# Patient Record
Sex: Female | Born: 2000 | Hispanic: No | Marital: Single | Smoking: Never smoker
Health system: Southern US, Community
[De-identification: ages and names within clinical notes are randomized; demographics above are authoritative.]

## PROBLEM LIST (undated history)

## (undated) DIAGNOSIS — E282 Polycystic ovarian syndrome: Secondary | ICD-10-CM

## (undated) DIAGNOSIS — E119 Type 2 diabetes mellitus without complications: Secondary | ICD-10-CM

## (undated) HISTORY — DX: Type 2 diabetes mellitus without complications: E11.9

## (undated) HISTORY — PX: TONSILLECTOMY AND ADENOIDECTOMY: SUR1326

## (undated) HISTORY — PX: MOUTH SURGERY: SHX715

## (undated) HISTORY — DX: Polycystic ovarian syndrome: E28.2

---
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
## 2022-01-10 ENCOUNTER — Telehealth: Payer: Medicaid Other | Admitting: Physician Assistant

## 2022-01-10 DIAGNOSIS — Z202 Contact with and (suspected) exposure to infections with a predominantly sexual mode of transmission: Secondary | ICD-10-CM

## 2022-01-10 NOTE — Progress Notes (Signed)
Because of mention of recent possible exposure to STI, I feel your condition warrants further evaluation and I recommend that you be seen in a face to face visit.   NOTE: There will be NO CHARGE for this eVisit   If you are having a true medical emergency please call 911.      For an urgent face to face visit,  has seven urgent care centers for your convenience:     [facility] at [facility] Directions [phone] [address] [place], [place] [address]    [facility]) Get Driving Directions [phone] [address] [place], [place] [address]  [facility]gent [facility] Center [facility]) Get Driving Directions [phone] [address] [place],  [place]  [address]  [facility] Center [facility] [employer] Directions  [phone] [address] [place],  [place] [address]   [facility] at [facility] Get Driving Directions [phone] [address], [address] [place], [place] [address]   [facility] Health Urgent Care at [facility] Get Driving Directions  [phone] [address] [place], [place] [address]   [facility] Health Urgent Care at [facility] Directions [phone] [address], [address] [place], [place] [address]  Your MyChart E-visit questionnaire answers were reviewed by a board certified advanced clinical practitioner to complete your personal care plan based on your specific symptoms.  Thank you for using e-Visits.

## 2022-01-15 ENCOUNTER — Other Ambulatory Visit: Payer: Self-pay | Admitting: Family Medicine

## 2022-01-15 ENCOUNTER — Encounter: Payer: Self-pay | Admitting: Family Medicine

## 2022-01-15 ENCOUNTER — Telehealth: Payer: Medicaid Other

## 2022-01-15 MED ORDER — NIRMATRELVIR/RITONAVIR (PAXLOVID)TABLET: 3.0000 | ORAL_TABLET | Freq: Two times a day (BID) | ORAL | 0 refills | Status: AC

## 2022-01-29 ENCOUNTER — Telehealth: Payer: Medicaid Other | Admitting: Family Medicine

## 2022-01-29 DIAGNOSIS — R300 Dysuria: Secondary | ICD-10-CM

## 2022-01-29 NOTE — Progress Notes (Signed)
[place]

## 2022-07-12 ENCOUNTER — Telehealth: Payer: Self-pay | Admitting: Family Medicine

## 2022-07-12 NOTE — Telephone Encounter (Signed)
Outbound call placed to offer patient an appointment for a diabetic retinal eye exam. Unable to reach patient; automated message says call cannot be completed at this time and to try the call again later.

## 2022-07-13 ENCOUNTER — Telehealth: Payer: Self-pay

## 2022-07-13 NOTE — Telephone Encounter (Signed)
LVM for patient to call back [phone], or to call PCP office to schedule follow up apt. AS, CMA  

## 2022-07-18 ENCOUNTER — Ambulatory Visit
Admission: RE | Admit: 2022-07-18 | Discharge: 2022-07-18 | Disposition: A | Discharge status: Home / Self Care | Payer: Medicaid Other | Source: Ambulatory Visit | Attending: Internal Medicine | Admitting: Internal Medicine

## 2022-07-18 VITALS — BP 130/80 | HR 104 | Temp 98.7°F | Resp 18

## 2022-07-18 DIAGNOSIS — A749 Chlamydial infection, unspecified: Secondary | ICD-10-CM | POA: Diagnosis not present

## 2022-07-18 DIAGNOSIS — Z113 Encounter for screening for infections with a predominantly sexual mode of transmission: Secondary | ICD-10-CM | POA: Insufficient documentation

## 2022-07-18 MED ORDER — AZITHROMYCIN 500 MG PO TABS: 1000.0000 mg | ORAL_TABLET | Freq: Once | ORAL | 0 refills | Status: AC

## 2022-07-18 NOTE — Discharge Instructions (Signed)
Your vaginal swab is pending.  I have prescribed you azithromycin to treat chlamydia infection.  Refrain from sexual activity until test results and treatment are complete.

## 2022-07-18 NOTE — ED Triage Notes (Signed)
Patient states she was recently tested for STDs and was positive for chlamydia. Patient states she threw up the prescription and wants another prescription.

## 2022-07-18 NOTE — ED Provider Notes (Signed)
[facility]    CSN: [id number] Arrival date & time: 07/18/22  1653      History   Chief Complaint Chief Complaint  Patient presents with   SEXUALLY TRANSMITTED DISEASE    HPI Sheena Dean is a 22 y.o. female.   Patient presents today for treatment for chlamydia.  Patient was seen at a different site in a different state about 3 weeks ago and tested positive for chlamydia at that time.  At that time, she was also having brown vaginal discharge which is now resolved.  She reports that she is concerned that it was not fully treated given that she only took 3 days of doxycycline.  She stopped taking it given that she was not able to tolerate it despite taking it with food.  Denies any new sexual partners.  Reports that she took a pregnancy test last week and it was negative.  She does not have normal menstrual cycles given that she has PCOS.  Denies any additional symptoms.     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 No pertinent surgical history.  OB History   No obstetric history on file.      Home Medications    Prior to Admission medications   Medication Sig Start Date End Date Taking? Authorizing Provider  azithromycin (ZITHROMAX) 500 MG tablet Take 2 tablets (1,000 mg total) by mouth once for 1 dose. 07/18/22 07/18/22 Yes Kavaughn Faucett, Acie Fredrickson, FNP  tirzepatide  [facility]) 5 MG/0.5ML Pen Inject 5 mg into the skin once a week. 05/15/21   Donita Brooks, MD    Family History Family History  Problem Relation Age of Onset   Obesity Brother    Breast cancer Maternal Grandmother 25    Social History Social History   Tobacco Use   Smoking status: Never   Smokeless tobacco: Never  Vaping Use   Vaping Use: Every day  Substance Use Topics   Alcohol use: No    Comment: occ   Drug use: No     Allergies   Latex   Review of Systems Review of Systems Per HPI  Physical Exam Triage Vital Signs ED Triage Vitals  Enc Vitals Group     BP 07/18/22 1703 130/80     Pulse Rate 07/18/22 1703 (!) 104     Resp 07/18/22 1703 18     Temp 07/18/22 1703 98.7 F (37.1 C)     Temp Source 07/18/22 1703 Oral     SpO2 07/18/22 1703 97 %     Weight --      Height --      Head Circumference --      Peak Flow --      Pain Score 07/18/22 1704 0     Pain Loc --      Pain Edu? --      Excl. in GC? --  No data found.  Updated Vital Signs BP 130/80 (BP Location: Left Arm)   Pulse (!) 104   Temp 98.7 F (37.1 C) (Oral)   Resp 18   LMP  (LMP Unknown)   SpO2 97%   Visual Acuity Right Eye Distance:   Left Eye Distance:   Bilateral Distance:    Right Eye Near:   Left Eye Near:    Bilateral Near:     Physical Exam Constitutional:      General: She is not in acute distress.    Appearance: Normal appearance. She is not toxic-appearing or diaphoretic.  HENT:     Head: Normocephalic and atraumatic.  Eyes:     Extraocular Movements: Extraocular movements intact.     Conjunctiva/sclera: Conjunctivae normal.  Pulmonary:     Effort: Pulmonary effort is normal.  Genitourinary:    Comments: Deferred with shared decision making.  Self swab performed. Neurological:     General: No focal deficit present.     Mental Status: She is alert and oriented to person, place, and time. Mental status is at baseline.  Psychiatric:        Mood and Affect:  Mood normal.        Behavior: Behavior normal.        Thought Content: Thought content normal.        Judgment: Judgment normal.      UC Treatments / Results  Labs (all labs ordered are listed, but only abnormal results are displayed) Labs Reviewed  CERVICOVAGINAL ANCILLARY ONLY    EKG   Radiology No results found.  Procedures Procedures (including critical care time)  Medications Ordered in UC Medications - No data to display  Initial Impression / Assessment and Plan / UC Course  I have reviewed the triage vital signs and the nursing notes.  Pertinent labs & imaging results that were available during my care of the patient were reviewed by me and considered in my medical decision making (see chart for details).     Patient presents today for chlamydia infection treatment.  She reports that she did not complete her previous doxycycline.  Will confirm with cervicovaginal swab.  Awaiting results.  Given patient did not complete treatment, will opt to treat again today.  Patient does not want to take doxycycline so will use alternative azithromycin 1 g dose.  She does not want to take medication today in urgent care so it was sent to the pharmacy for her to take.  Advised strict follow-up precautions and refraining from sexual activity until test results and treatment are complete.  Patient verbalized understanding and was agreeable with plan. Final Clinical Impressions(s) / UC Diagnoses   Final diagnoses:  Chlamydia infection  Screening examination for venereal disease     Discharge Instructions      Your vaginal swab is pending.  I have prescribed you azithromycin to treat chlamydia infection.  Refrain from sexual activity until test results and treatment are complete.    ED Prescriptions     Medication Sig Dispense Auth. Provider   azithromycin (ZITHROMAX) 500 MG tablet Take 2 tablets (1,000 mg total) by mouth once for 1 dose. 2 tablet [place], Acie Fredrickson, [place]       PDMP not reviewed this encounter.   Gustavus Bryant, [place] 07/18/22 1721

## 2022-07-19 LAB — CERVICOVAGINAL ANCILLARY ONLY
Bacterial Vaginitis (gardnerella): NEGATIVE
Candida Glabrata: NEGATIVE
Candida Vaginitis: NEGATIVE
Chlamydia: NEGATIVE
Comment: NEGATIVE
Comment: NEGATIVE
Comment: NEGATIVE
Comment: NEGATIVE
Comment: NEGATIVE
Comment: NORMAL
Neisseria Gonorrhea: NEGATIVE
Trichomonas: NEGATIVE

## 2022-09-07 ENCOUNTER — Ambulatory Visit
Admission: RE | Admit: 2022-09-07 | Discharge: 2022-09-07 | Disposition: A | Discharge status: Home / Self Care | Payer: Medicaid Other | Source: Ambulatory Visit | Attending: Internal Medicine | Admitting: Internal Medicine

## 2022-09-07 VITALS — BP 118/78 | HR 99 | Temp 98.3°F | Resp 16

## 2022-09-07 DIAGNOSIS — N3001 Acute cystitis with hematuria: Secondary | ICD-10-CM

## 2022-09-07 DIAGNOSIS — Z113 Encounter for screening for infections with a predominantly sexual mode of transmission: Secondary | ICD-10-CM | POA: Diagnosis not present

## 2022-09-07 DIAGNOSIS — N898 Other specified noninflammatory disorders of vagina: Secondary | ICD-10-CM

## 2022-09-07 DIAGNOSIS — Z3202 Encounter for pregnancy test, result negative: Secondary | ICD-10-CM

## 2022-09-07 LAB — POCT URINALYSIS DIP (MANUAL ENTRY)
Bilirubin, UA: NEGATIVE
Glucose, UA: NEGATIVE mg/dL
Ketones, POC UA: NEGATIVE mg/dL
Nitrite, UA: POSITIVE — AB
Spec Grav, UA: 1.015 (ref 1.010–1.025)
Urobilinogen, UA: 0.2 E.U./dL
pH, UA: 7 (ref 5.0–8.0)

## 2022-09-07 LAB — POCT URINE PREGNANCY: Preg Test, Ur: NEGATIVE

## 2022-09-07 MED ORDER — CEPHALEXIN 500 MG PO CAPS: 500.0000 mg | ORAL_CAPSULE | Freq: Two times a day (BID) | ORAL | 0 refills | Status: AC

## 2022-09-07 NOTE — ED Triage Notes (Signed)
Pt states she is having burning when she urinates and she is also here for a pregnancy test and to be rechecked for STD's.

## 2022-09-07 NOTE — Discharge Instructions (Addendum)
I am treating you for UTI with antibiotic today.  Urine culture and vaginal swab are pending.  Will call with that result.  Follow-up with gynecology at provided phone number for further evaluation and management.

## 2022-09-07 NOTE — ED Provider Notes (Signed)
[facility]    CSN: [id number] Arrival date & time: 09/07/22  4098      History   Chief Complaint Chief Complaint  Patient presents with   Medication Refill    Entered by patient    HPI Sheena Dean is a 22 y.o. female.   Patient presents with urinary burning and frequency that started a few days ago.  Denies any current vaginal discharge but reports that it was present prior to her menstrual cycle starting in mid July.  She also states that her menstrual cycle lasted for about 2 weeks which is unusual for her.  Otherwise, she has been having normal menstrual cycles.  She is sexually active and has had unprotected intercourse but denies exposure to STD.  Patient was seen on 07/18/2022 for STD testing at that time as well.  At that time, she stated that she had tested positive for chlamydia at a different office.  She did not tolerate doxycycline so she was prescribed a one-time dose of azithromycin.  She states that she never picked it up until yesterday where she attempted to take it but accidentally dropped in the toilet so she was not able to take that dose.  Denies any current vaginal discharge, abdominal pain, pelvic pain, back pain, fever, chills.  She does not use any form of birth control at this time.   Medication Refill   Past Medical History:  Diagnosis Date   Diabetes mellitus without complication (HCC)    PCOS (polycystic ovarian syndrome)     Patient Active Problem List   Diagnosis Date Noted   Moderate episode of recurrent major depressive disorder (HCC) 01/04/2021   Insomnia 01/04/2021   PCOS (polycystic ovarian syndrome) 02/24/2020   Diabetes mellitus (HCC) 02/24/2020   Tinea pedis of both feet 02/24/2020   UTI symptoms 02/10/2020   Class 2 obesity 01/19/2020   Acne vulgaris 12/15/2019   Strain of gastrocnemius tendon of right lower extremity 11/14/2017   Generalized anxiety disorder 07/16/2017   Depressed mood 07/16/2017    History  reviewed. No pertinent surgical history.  OB History   No obstetric history on file.      Home Medications    Prior to Admission medications   Medication Sig Start Date End Date Taking? Authorizing Provider  cephALEXin (KEFLEX) 500 MG capsule Take 1 capsule (500 mg total) by mouth 2 (two) times daily for 7 days. 09/07/22 09/14/22 Yes Jawad Wiacek, Acie Fredrickson, FNP  tirzepatide [facility]) 5 MG/0.5ML Pen Inject 5 mg into the skin once a week. 05/15/21   Donita Brooks, MD    Family History Family History  Problem Relation Age of Onset   Obesity Brother    Breast cancer Maternal Grandmother 78    Social History Social History   Tobacco Use   Smoking status: Never   Smokeless tobacco: Never  Vaping Use   Vaping status: Every Day  Substance Use Topics   Alcohol use: No    Comment: occ   Drug use: No     Allergies   Latex   Review of Systems Review of Systems Per HPI  Physical Exam Triage Vital Signs ED Triage Vitals  Encounter Vitals Group     BP 09/07/22 1848 118/78     Systolic BP Percentile --      Diastolic BP Percentile --      Pulse Rate 09/07/22 1848 99     Resp 09/07/22 1848 16     Temp 09/07/22 1848 98.3  F (36.8 C)     Temp Source 09/07/22 1848 Oral     SpO2 09/07/22 1848 96 %     Weight --      Height --      Head Circumference --      Peak Flow --      Pain Score 09/07/22 1855 0     Pain Loc --      Pain Education --      Exclude from Growth Chart --    No data found.  Updated Vital Signs BP 118/78 (BP Location: Left Arm)   Pulse 99   Temp 98.3 F (36.8 C) (Oral)   Resp 16   LMP 08/12/2022 (Exact Date)   SpO2 96%   Visual Acuity Right Eye Distance:   Left Eye Distance:   Bilateral Distance:    Right Eye Near:   Left Eye Near:    Bilateral Near:     Physical Exam Constitutional:      General: She is not in acute distress.    Appearance: Normal appearance. She is not toxic-appearing or diaphoretic.  HENT:     Head: Normocephalic  and atraumatic.  Eyes:     Extraocular Movements: Extraocular movements intact.     Conjunctiva/sclera: Conjunctivae normal.  Pulmonary:     Effort: Pulmonary effort is normal.  Genitourinary:    Comments: Deferred with shared decision making. Self swab performed.  Neurological:     General: No focal deficit present.     Mental Status: She is alert and oriented to person, place, and time. Mental status is at baseline.  Psychiatric:        Mood and Affect: Mood normal.        Behavior: Behavior normal.        Thought Content: Thought content normal.        Judgment: Judgment normal.      UC Treatments / Results  Labs (all labs ordered are listed, but only abnormal results are displayed) Labs Reviewed  POCT URINALYSIS DIP (MANUAL ENTRY) - Abnormal; Notable for the following components:      Result Value   Color, UA straw (*)    Clarity, UA cloudy (*)    Blood, UA small (*)    Protein Ur, POC trace (*)    Nitrite, UA Positive (*)    Leukocytes, UA Large (3+) (*)    All other components within normal limits  URINE CULTURE  POCT URINE PREGNANCY  CERVICOVAGINAL ANCILLARY ONLY    EKG   Radiology No results found.  Procedures Procedures (including critical care time)  Medications Ordered in UC Medications - No data to display  Initial Impression / Assessment and Plan / UC Course  I have reviewed the triage vital signs and the nursing notes.  Pertinent labs & imaging results that were available during my care of the patient were reviewed by me and considered in my medical decision making (see chart for details).     UA indicating possible UTI so will opt to treat with cephalexin today.  Urine culture pending.  Patient also requesting STD testing with vaginal swab so this is pending as well.  Patient's previous vaginal swab on 6/19 was negative for chlamydia so will await swab prior to doing any additional treatment despite patient not taking medication that was  prescribed at previous visit.  Pregnancy test negative.  Given patient had an abnormal menstrual cycle, recommended that she see gynecology for further evaluation and management for  this and patient was provided with contact information for gynecology.  Advised strict follow-up precautions.  Patient verbalized understanding and was agreeable with plan. Final Clinical Impressions(s) / UC Diagnoses   Final diagnoses:  Acute cystitis with hematuria  Urine pregnancy test negative  Vaginal discharge  Screening examination for venereal disease     Discharge Instructions      I am treating you for UTI with antibiotic today.  Urine culture and vaginal swab are pending.  Will call with that result.  Follow-up with gynecology at provided phone number for further evaluation and management.    ED Prescriptions     Medication Sig Dispense Auth. Provider   cephALEXin (KEFLEX) 500 MG capsule Take 1 capsule (500 mg total) by mouth 2 (two) times daily for 7 days. 14 capsule [place], Acie Fredrickson, [place]      PDMP not reviewed this encounter.   Gustavus Bryant, [place] 09/07/22 [phone]

## 2022-09-10 ENCOUNTER — Telehealth: Payer: Self-pay

## 2022-09-10 LAB — CERVICOVAGINAL ANCILLARY ONLY
Bacterial Vaginitis (gardnerella): NEGATIVE
Candida Glabrata: NEGATIVE
Candida Vaginitis: NEGATIVE
Chlamydia: NEGATIVE
Comment: NEGATIVE
Comment: NEGATIVE
Comment: NEGATIVE
Comment: NEGATIVE
Comment: NEGATIVE
Comment: NORMAL
Neisseria Gonorrhea: NEGATIVE
Trichomonas: NEGATIVE

## 2022-09-10 NOTE — Telephone Encounter (Signed)
Patient "returning someone's call regarding results".  After review of labs/notes I discussed the following per Marianna Payment, UC (see below):   Hello Sheena Dean,   Your urine culture showed you do have a UTI and the antibiotic we sent you home on will treat it.  Please make sure to take it until it is completely gone and follow up with your Primary Care doctor if you continue to have symptoms after completion.     If you have any questions or concerns, please do not hesitate to reach out at [phone].   Sincerely,   Kendell, Urgent Care RN

## 2023-02-01 ENCOUNTER — Ambulatory Visit (INDEPENDENT_AMBULATORY_CARE_PROVIDER_SITE_OTHER): Payer: Medicaid Other | Admitting: Family Medicine

## 2023-02-01 ENCOUNTER — Ambulatory Visit: Payer: Self-pay | Admitting: Family Medicine

## 2023-02-01 ENCOUNTER — Encounter: Payer: Self-pay | Admitting: Family Medicine

## 2023-02-01 ENCOUNTER — Encounter (INDEPENDENT_AMBULATORY_CARE_PROVIDER_SITE_OTHER): Payer: Medicaid Other | Admitting: Family Medicine

## 2023-02-01 VITALS — BP 120/72 | HR 84 | Temp 98.4°F | Ht 62.0 in | Wt 210.0 lb

## 2023-02-01 DIAGNOSIS — E1169 Type 2 diabetes mellitus with other specified complication: Secondary | ICD-10-CM | POA: Diagnosis not present

## 2023-02-01 DIAGNOSIS — N938 Other specified abnormal uterine and vaginal bleeding: Secondary | ICD-10-CM | POA: Diagnosis not present

## 2023-02-01 MED ORDER — NORETHINDRONE ACET-ETHINYL EST 1.5-30 MG-MCG PO TABS: 1.0000 | ORAL_TABLET | Freq: Every day | ORAL | 11 refills | Status: DC

## 2023-02-01 NOTE — Progress Notes (Signed)
 Subjective:    Patient ID: Sheena Dean, female    DOB: 01/08/2001, 23 y.o.   MRN: [id number]  HPI 2 issues.  First the patient states that she never has regular periods.  She states that she has a menstrual cycle every 2 to 3 months.  Her menses has variable duration.  Sometimes it can last 2 weeks.  Most recently it is lasted for weeks.  She reports spotting and bleeding for the last 4 weeks straight.  At other times she will have no menstrual cycles for 2 or 3 months.  She took 2 pregnancy test yesterday that were negative.  She has a history of PCOS.  She also has a history of type 2 diabetes.  Her hemoglobin A1c over a year ago was 6.8.  I started the patient on Mounjaro  but she has since stopped the medication due to nausea Past Medical History:  Diagnosis Date   Diabetes mellitus without complication (HCC)    PCOS (polycystic ovarian syndrome)    No past surgical history on file.   Allergies  Allergen Reactions   Latex Hives   Social History   Socioeconomic History   Marital status: Single    Spouse name: Not on file   Number of children: Not on file   Years of education: Not on file   Highest education level: Not on file  Occupational History   Not on file  Tobacco Use   Smoking status: Never   Smokeless tobacco: Never  Vaping Use   Vaping status: Every Day  Substance and Sexual Activity   Alcohol use: No    Comment: occ   Drug use: No   Sexual activity: Yes    Comment: Lives with mom, dad, and brother  Other Topics Concern   Not on file  Social History Narrative   Currently in fifth grade.  No menarche. [place] II.     Social Drivers of Corporate Investment Banker Strain: Not on file  Food Insecurity: Not on file  Transportation Needs: Not on file  Physical Activity: Not on file  Stress: Not on file  Social Connections: Unknown (08/18/2021)   Received from [facility]   Social Network    Social Network: Not on file  Intimate Partner  Violence: Unknown (08/18/2021)   Received from [facility], [facility]   HITS    Physically Hurt: Not on file    Insult or Talk Down To: Not on file    Threaten Physical Harm: Not on file    Scream or Curse: Not on file    Review of Systems     Objective:   Physical Exam Vitals reviewed.  Constitutional:      General: She is not in acute distress.    Appearance: Normal appearance. She is obese. She is not ill-appearing or toxic-appearing.  Cardiovascular:     Rate and Rhythm: Normal rate and regular rhythm.     Heart sounds: Normal heart sounds. No murmur heard.    No friction rub. No gallop.  Pulmonary:     Effort: Pulmonary effort is normal. No respiratory distress.     Breath sounds: Normal breath sounds. No stridor. No wheezing, rhonchi or rales.  Abdominal:     General: Abdomen is flat. Bowel sounds are normal. There is no distension.     Palpations: Abdomen is soft. There is no mass.     Tenderness: There is no abdominal tenderness. There is no guarding or  rebound.     Hernia: No hernia is present.  Neurological:     Mental Status: She is alert.         Assessment & Plan:  Type 2 diabetes mellitus with other specified complication, without long-term current use of insulin (HCC) - Plan: Hemoglobin A1c, CBC with Differential/Platelet, COMPLETE METABOLIC PANEL WITH GFR, Microalbumin/Creatinine Ratio, Urine  DUB (dysfunctional uterine bleeding) Had a long discussion today with the patient.  I believe that she has dysfunctional uterine bleeding due to anovulation likely caused by PCOS.  I believe the PCOS is also contributing to her insulin resistance that is ultimately caused type 2 diabetes.  The best way to address both of these issues would be weight loss.  I will check fasting lab work.  If her hemoglobin A1c is greater than 6.5 I will start the patient on Ozempic  to help manage her sugars and to facilitate weight loss.  Meanwhile, I started the patient on Loestrin  1 pill daily to help with dysfunctional uterine bleeding and to regulate her menstrual cycles.

## 2023-02-01 NOTE — Telephone Encounter (Signed)
 Copied from CRM [id number]. Topic: Clinical - Red Word Triage >> Feb 01, 2023 10:37 AM Sheena Dean wrote: Red Word that prompted transfer to Nurse Triage: Patient has lost the sense of smell since christmas. Patients symptoms include: cough, congestion & fever.  Chief Complaint:  Cough, congestion & fever. Lost  Sense  of Smell   3 years  and it never  return  advised  a few months to  get a nose referral  Patient reports Cough , Congestion, Fever. Patient  was going in for   birthcontrol. Doesn't  Symptoms:  Dry Cough unable to get any thing up. Nasal Congestion and Fever 101.10  F on last night- no fever today Frequency:  Dry cough everyday for a while Pertinent Negatives: Patient denies SOB. Disposition: [] ED /[] Urgent Care (no appt availability in office) / [x] Appointment(In office/virtual)/ []  [facility]/ [] Home Care/ [] Refused Recommended Disposition /[] [facility]/ []  Follow-up with PCP Additional Notes: Patient originally ca Reason for Disposition  [1] Fever > 100.0 F (37.8 C) AND [2] diabetes mellitus or weak immune system (e.g., HIV positive, cancer chemo, splenectomy, organ transplant, chronic steroids)  Answer Assessment - Initial Assessment Questions 1. ONSET: When did the cough begin?       Christmas  2. SEVERITY: How bad is the cough today?       Consider here cough as Mild  3. SPUTUM: Describe the color of your sputum (none, dry cough; clear, white, yellow, green)      None 4. HEMOPTYSIS: Are you coughing up any blood? If so ask: How much? (flecks, streaks, tablespoons, etc.)     None 5. DIFFICULTY BREATHING: Are you having difficulty breathing? If Yes, ask: How bad is it? (e.g., mild, moderate, severe)    Denies 6. FEVER: Do you have a fever? If Yes, ask: What is your temperature, how was it measured, and when did it start?     101.10  F.  only  one  night and denies fever at this mine  7. CARDIAC HISTORY: Do you have any history of  heart disease? (e.g., heart attack, congestive heart failure)       Denies 8. LUNG HISTORY: Do you have any history of lung disease?  (e.g., pulmonary embolus, asthma, emphysema)     Denies 9. PE RISK FACTORS: Do you have a history of blood clots? (or: recent major surgery, recent prolonged travel, bedridden)     Denies 10. OTHER SYMPTOMS: Do you have any other symptoms? (e.g., runny nose, wheezing, chest pain)         Denies 11. PREGNANCY: Is there any chance you are pregnant? When was your last menstrual period?        Took a pregnancy test yesterday   and it was negative/  Been on her cycle   Nov period last 3. Period Irregular and had questions 12. TRAVEL: Have you traveled out of the country in the last month? (e.g., travel history, exposures)       Denies  Protocols used: Cough - Acute Non-Productive-A-AH

## 2023-02-02 LAB — CBC WITH DIFFERENTIAL/PLATELET
Absolute Lymphocytes: 3450 {cells}/uL (ref 850–3900)
Absolute Monocytes: 672 {cells}/uL (ref 200–950)
Basophils Absolute: 78 {cells}/uL (ref 0–200)
Basophils Relative: 0.7 %
Eosinophils Absolute: 146 {cells}/uL (ref 15–500)
Eosinophils Relative: 1.3 %
HCT: 41.5 % (ref 35.0–45.0)
Hemoglobin: 13.9 g/dL (ref 11.7–15.5)
MCH: 28.2 pg (ref 27.0–33.0)
MCHC: 33.5 g/dL (ref 32.0–36.0)
MCV: 84.2 fL (ref 80.0–100.0)
MPV: 10.1 fL (ref 7.5–12.5)
Monocytes Relative: 6 %
Neutro Abs: 6854 {cells}/uL (ref 1500–7800)
Neutrophils Relative %: 61.2 %
Platelets: 395 10*3/uL (ref 140–400)
RBC: 4.93 10*6/uL (ref 3.80–5.10)
RDW: 12.5 % (ref 11.0–15.0)
Total Lymphocyte: 30.8 %
WBC: 11.2 10*3/uL — ABNORMAL HIGH (ref 3.8–10.8)

## 2023-02-02 LAB — COMPLETE METABOLIC PANEL WITH GFR
AG Ratio: 1.7 (calc) (ref 1.0–2.5)
ALT: 16 U/L (ref 6–29)
AST: 14 U/L (ref 10–30)
Albumin: 4.2 g/dL (ref 3.6–5.1)
Alkaline phosphatase (APISO): 89 U/L (ref 31–125)
BUN: 10 mg/dL (ref 7–25)
CO2: 23 mmol/L (ref 20–32)
Calcium: 9.2 mg/dL (ref 8.6–10.2)
Chloride: 107 mmol/L (ref 98–110)
Creat: 0.69 mg/dL (ref 0.50–0.96)
Globulin: 2.5 g/dL (ref 1.9–3.7)
Glucose, Bld: 115 mg/dL — ABNORMAL HIGH (ref 65–99)
Potassium: 4 mmol/L (ref 3.5–5.3)
Sodium: 140 mmol/L (ref 135–146)
Total Bilirubin: 0.2 mg/dL (ref 0.2–1.2)
Total Protein: 6.7 g/dL (ref 6.1–8.1)
eGFR: 127 mL/min/{1.73_m2} (ref >=60)

## 2023-02-02 LAB — HEMOGLOBIN A1C
Hgb A1c MFr Bld: 7 %{Hb} — ABNORMAL HIGH (ref <5.7)
Mean Plasma Glucose: 154 mg/dL
eAG (mmol/L): 8.5 mmol/L

## 2023-02-02 LAB — MICROALBUMIN / CREATININE URINE RATIO
Creatinine, Urine: 196 mg/dL (ref 20–275)
Microalb Creat Ratio: 3 mg/g{creat} (ref <30)
Microalb, Ur: 0.6 mg/dL

## 2023-02-04 NOTE — Progress Notes (Signed)
 Error   This encounter was created in error - please disregard.

## 2023-03-29 ENCOUNTER — Encounter: Payer: Self-pay | Admitting: Family Medicine

## 2023-04-01 ENCOUNTER — Other Ambulatory Visit: Payer: Self-pay

## 2023-04-01 MED ORDER — SEMAGLUTIDE(0.25 OR 0.5MG/DOS) 2 MG/3ML ~~LOC~~ SOPN
0.2500 mg | PEN_INJECTOR | SUBCUTANEOUS | 1 refills | Status: DC
Start: 2023-04-01 — End: 2023-04-04

## 2023-04-03 ENCOUNTER — Telehealth: Payer: Self-pay

## 2023-04-03 NOTE — Telephone Encounter (Signed)
 Forms for PA completed and placed in green folder for Dr. Tanya Nones to sign. Mjp,lpn

## 2023-04-04 ENCOUNTER — Other Ambulatory Visit: Payer: Self-pay

## 2023-04-04 ENCOUNTER — Other Ambulatory Visit: Payer: Self-pay | Admitting: Family Medicine

## 2023-04-04 MED ORDER — SEMAGLUTIDE(0.25 OR 0.5MG/DOS) 2 MG/3ML ~~LOC~~ SOPN: 0.2500 mg | PEN_INJECTOR | SUBCUTANEOUS | 1 refills | Status: DC

## 2023-04-04 NOTE — Telephone Encounter (Signed)
 Copied from CRM [id number]. Topic: Clinical - Prescription Issue >> Apr 04, 2023 10:09 AM [name] wrote: Reason for CRM: patient is calling cause they sent her prescription to the wrong pharmacy. Semaglutide,0.25 or 0.5MG /DOS, 2 MG/3ML SOPN The correct pharmacy  [id number] [name], [place] - [address] AT [facility] [address] [place] [address] Phone: [phone] Fax: [phone] Hours: Not open 24 hours  Patient is requesting that this medication be sent to [facility]

## 2023-04-04 NOTE — Telephone Encounter (Signed)
 Copied from CRM [id number]. Topic: Clinical - Medication Refill >> Apr 04, 2023 10:13 AM [name] wrote: Most Recent Primary Care Visit:  Provider: Lynnea Ferrier T  Department: [facility]  Visit Type: OFFICE VISIT  Date: 02/01/2023  Medication: Semaglutide,0.25 or 0.5MG /DOS, 2 MG/3ML SOPN   Has the patient contacted their pharmacy? Yes sent to wrong pharmacy (Agent: If no, request that the patient contact the pharmacy for the refill. If patient does not wish to contact the pharmacy document the reason why and proceed with request.) (Agent: If yes, when and what did the pharmacy advise?)  Is this the correct pharmacy for this prescription? Yes If no, delete pharmacy and type the correct one.  This is the patient's preferred pharmacy:  [id number] [name], [place] - [address] AT [facility] [address] [place] [place] [address] Phone: [phone] Fax: [phone]   Has the prescription been filled recently? Yes but it was sent to wrong pharmacy   Is the patient out of the medication? Yes  Has the patient been seen for an appointment in the last year OR does the patient have an upcoming appointment? Yes  Can we respond through MyChart? Yes  Agent: Please be advised that Rx refills may take up to 3 business days. We ask that you follow-up with your pharmacy.

## 2023-04-12 ENCOUNTER — Ambulatory Visit
Admission: EM | Admit: 2023-04-12 | Discharge: 2023-04-12 | Disposition: A | Discharge status: Home / Self Care | Attending: Nurse Practitioner | Admitting: Nurse Practitioner

## 2023-04-12 DIAGNOSIS — R82998 Other abnormal findings in urine: Secondary | ICD-10-CM | POA: Diagnosis present

## 2023-04-12 DIAGNOSIS — R399 Unspecified symptoms and signs involving the genitourinary system: Secondary | ICD-10-CM | POA: Insufficient documentation

## 2023-04-12 LAB — POCT URINALYSIS DIP (MANUAL ENTRY)
Glucose, UA: 500 mg/dL — AB
Nitrite, UA: POSITIVE — AB
Protein Ur, POC: >=300 mg/dL — AB
Spec Grav, UA: <=1.005 — AB
Urobilinogen, UA: >=8 U/dL — AB
pH, UA: 5

## 2023-04-12 MED ORDER — NITROFURANTOIN MONOHYD MACRO 100 MG PO CAPS: 100.0000 mg | ORAL_CAPSULE | Freq: Two times a day (BID) | ORAL | 0 refills | Status: DC

## 2023-04-12 NOTE — ED Triage Notes (Signed)
"  I have had UTI symptoms since Sunday night and taking AZO and the pain had went away with urination and abd discomfort but is back today". No fever. No vaginal discharge or concern for STI "but would like Cervical swab testing if ok".

## 2023-04-12 NOTE — Discharge Instructions (Addendum)
 Your urinalysis indicates positive nitrates which is probably related to the AZO.  It does indicate large leukocytes and blood which is probably related to urinary tract infection.  Your urine will be sent for a culture.  We will start you on prophylactic treatment due to your history of diabetes.  The Macrobid 100 mg twice daily for 5 days.  Recommendation is for you to hydrate well with water and 100% cranberry juice.  You need at least 2 L of water daily. Your vaginal swab is pending for the STDs that she requested your results will be indicated in your MyChart if there are any positive infections you will be notified by nurse with next step treatment options and recommendations.  While your test is pending the recommendation is to use condoms to prevent the spread of any infections.

## 2023-04-12 NOTE — ED Provider Notes (Signed)
 [facility]    CSN: [id number] Arrival date & time: 04/12/23  7829      History   Chief Complaint Chief Complaint  Patient presents with   SEXUALLY TRANSMITTED DISEASE    Testing   UTI Symptoms    HPI Sheena Dean is a 23 y.o. female.   HPI  She is seen today for evaluation of urinary frequency.   She endorses that the symptoms have been going on since Sunday.  She did treat with over-the-counter AZO.  She feels like the pain and discomfort went away but returned.  She endorses that she had 4 episodes urinary frequency this morning. She denies any fever, chills, nausea, vomiting.  She endorses that she does not want to be tested for STDs. Denies any pelvic pain or tenderness, amenorrhea irregular bleeding or prolonged heavy bleeding.  Denies vaginal discharge or dysuria.  Denies ulcers or lesions  Past Medical History:  Diagnosis Date   Diabetes mellitus without complication (HCC)    PCOS (polycystic ovarian syndrome)     Patient Active Problem List   Diagnosis Date Noted   Moderate episode of recurrent major depressive disorder (HCC) 01/04/2021   Insomnia 01/04/2021   PCOS (polycystic ovarian syndrome) 02/24/2020   Diabetes mellitus (HCC) 02/24/2020   Tinea pedis of both feet 02/24/2020   UTI symptoms 02/10/2020   Class 2 obesity 01/19/2020   Acne vulgaris 12/15/2019   Strain of gastrocnemius tendon of right lower extremity 11/14/2017   Generalized anxiety disorder 07/16/2017   Depressed mood 07/16/2017    History reviewed. No pertinent surgical history.  OB History   No obstetric history on file.      Home Medications    Prior to Admission medications   Medication Sig Start Date End Date Taking? Authorizing Provider  nitrofurantoin, macrocrystal-monohydrate, (MACROBID) 100 MG capsule Take 1 capsule (100 mg total) by mouth 2 (two) times daily. 04/12/23  Yes Barbette Merino, NP  phenazopyridine (PYRIDIUM) 97 MG tablet Take 97 mg by mouth 3  (three) times daily as needed for pain.   Yes [provider]  Semaglutide,0.25 or 0.5MG /DOS, 2 MG/3ML SOPN Inject 0.25 mg into the skin once a week. 04/04/23  Yes Donita Brooks, MD  Norethindrone Acetate-Ethinyl Estradiol (LOESTRIN 1.5/30, 21,) 1.5-30 MG-MCG tablet Take 1 tablet by mouth daily. 02/01/23   Donita Brooks, MD  tirzepatide [facility]) 5 MG/0.5ML Pen Inject 5 mg into the skin once a week. Patient not taking: Reported on 02/01/2023 05/15/21   Donita Brooks, MD    Family History Family History  Problem Relation Age of Onset   Obesity Brother    Breast cancer Maternal Grandmother 86    Social History Social History   Tobacco Use   Smoking status: Never   Smokeless tobacco: Never  Vaping Use   Vaping status: Every Day   Substances: Nicotine, Flavoring  Substance Use Topics   Alcohol use: No    Comment: occ   Drug use: No     Allergies   Latex   Review of Systems Review of Systems   Physical Exam Triage Vital Signs ED Triage Vitals  Encounter Vitals Group     BP 04/12/23 0956 108/68     Systolic BP Percentile --      Diastolic BP Percentile --      Pulse Rate 04/12/23 0956 85     Resp 04/12/23 0956 18     Temp 04/12/23 0956 97.9 F (36.6 C)  Temp Source 04/12/23 0956 Oral     SpO2 04/12/23 0956 97 %     Weight 04/12/23 0952 212 lb 4.8 oz (96.3 kg)     Height 04/12/23 0952 5\' 2"  (1.575 m)     Head Circumference --      Peak Flow --      Pain Score 04/12/23 0952 0     Pain Loc --      Pain Education --      Exclude from Growth Chart --    No data found.  Updated Vital Signs BP 108/68 (BP Location: Left Arm)   Pulse 85   Temp 97.9 F (36.6 C) (Oral)   Resp 18   Ht 5\' 2"  (1.575 m)   Wt 212 lb 4.8 oz (96.3 kg)   LMP 03/28/2023 (Exact Date)   SpO2 97%   BMI 38.83 kg/m   Visual Acuity Right Eye Distance:   Left Eye Distance:   Bilateral Distance:    Right Eye Near:   Left Eye Near:    Bilateral Near:     Physical  Exam Constitutional:      Appearance: She is obese.  HENT:     Head: Normocephalic and atraumatic.  Cardiovascular:     Rate and Rhythm: Normal rate.  Pulmonary:     Effort: Pulmonary effort is normal.  Skin:    General: Skin is warm and dry.  Neurological:     General: No focal deficit present.     Mental Status: She is alert.      UC Treatments / Results  Labs (all labs ordered are listed, but only abnormal results are displayed) Labs Reviewed  POCT URINALYSIS DIP (MANUAL ENTRY) - Abnormal; Notable for the following components:      Result Value   Color, UA red (*)    Glucose, UA =500 (*)    Bilirubin, UA moderate (*)    Ketones, POC UA small (15) (*)    Spec Grav, UA <=1.005 (*)    Blood, UA trace-intact (*)    Protein Ur, POC >=300 (*)    Urobilinogen, UA >=8.0 (*)    Nitrite, UA Positive (*)    Leukocytes, UA Large (3+) (*)    All other components within normal limits  URINE CULTURE  CERVICOVAGINAL ANCILLARY ONLY    EKG   Radiology No results found.  Procedures Procedures (including critical care time)  Medications Ordered in UC Medications - No data to display  Initial Impression / Assessment and Plan / UC Course  I have reviewed the triage vital signs and the nursing notes.  Pertinent labs & imaging results that were available during my care of the patient were reviewed by me and considered in my medical decision making (see chart for details).     Urinary frequency Final Clinical Impressions(s) / UC Diagnoses   Final diagnoses:  UTI symptoms  Leukocytes in urine     Discharge Instructions      Your urinalysis indicates positive nitrates which is probably related to the AZO.  It does indicate large leukocytes and blood which is probably related to urinary tract infection.  Your urine will be sent for a culture.  We will start you on prophylactic treatment due to your history of diabetes.  The Macrobid 100 mg twice daily for 5 days.   Recommendation is for you to hydrate well with water and 100% cranberry juice.  You need at least 2 L of water daily. Your vaginal swab  is pending for the STDs that she requested your results will be indicated in your MyChart if there are any positive infections you will be notified by nurse with next step treatment options and recommendations.  While your test is pending the recommendation is to use condoms to prevent the spread of any infections.     ED Prescriptions     Medication Sig Dispense Auth. Provider   nitrofurantoin, macrocrystal-monohydrate, (MACROBID) 100 MG capsule Take 1 capsule (100 mg total) by mouth 2 (two) times daily. 10 capsule Barbette Merino, NP      PDMP not reviewed this encounter.   Thad Ranger [place], [place] 04/12/23 [phone]

## 2023-04-13 LAB — URINE CULTURE: Culture: <10000 — AB

## 2023-04-15 ENCOUNTER — Telehealth (HOSPITAL_COMMUNITY): Payer: Self-pay

## 2023-04-15 LAB — CERVICOVAGINAL ANCILLARY ONLY
Bacterial Vaginitis (gardnerella): POSITIVE — AB
Candida Glabrata: NEGATIVE
Candida Vaginitis: NEGATIVE
Chlamydia: NEGATIVE
Comment: NEGATIVE
Comment: NEGATIVE
Comment: NEGATIVE
Comment: NEGATIVE
Comment: NEGATIVE
Comment: NORMAL
Neisseria Gonorrhea: NEGATIVE
Trichomonas: NEGATIVE

## 2023-04-15 MED ORDER — METRONIDAZOLE 500 MG PO TABS
500.0000 mg | ORAL_TABLET | Freq: Two times a day (BID) | ORAL | 0 refills | Status: AC
Start: 2023-04-15 — End: 2023-04-22

## 2023-04-15 NOTE — Telephone Encounter (Signed)
 Per protocol, pt requires tx with metronidazole. Rx sent to pharmacy on file.

## 2023-07-04 ENCOUNTER — Ambulatory Visit: Admitting: Family Medicine

## 2023-07-04 ENCOUNTER — Encounter: Payer: Self-pay | Admitting: Family Medicine

## 2023-07-04 VITALS — BP 120/62 | HR 88 | Ht 62.0 in | Wt 216.2 lb

## 2023-07-04 DIAGNOSIS — Z975 Presence of (intrauterine) contraceptive device: Secondary | ICD-10-CM | POA: Diagnosis not present

## 2023-07-04 DIAGNOSIS — F32A Depression, unspecified: Secondary | ICD-10-CM

## 2023-07-04 DIAGNOSIS — F419 Anxiety disorder, unspecified: Secondary | ICD-10-CM | POA: Diagnosis not present

## 2023-07-04 DIAGNOSIS — N6310 Unspecified lump in the right breast, unspecified quadrant: Secondary | ICD-10-CM | POA: Diagnosis not present

## 2023-07-04 DIAGNOSIS — Z3009 Encounter for other general counseling and advice on contraception: Secondary | ICD-10-CM | POA: Diagnosis not present

## 2023-07-04 MED ORDER — FLUOXETINE HCL 10 MG PO CAPS: 10.0000 mg | ORAL_CAPSULE | Freq: Every day | ORAL | 1 refills | Status: DC

## 2023-07-04 NOTE — Progress Notes (Signed)
 Patient Office Visit  Assessment & Plan:  Mass of right breast, unspecified quadrant -     MM 3D DIAGNOSTIC MAMMOGRAM UNILATERAL RIGHT BREAST; Future  Birth control counseling -     Ambulatory referral to Obstetrics / Gynecology  Anxiety and depression -     FLUoxetine  HCl; Take 1 capsule (10 mg total) by mouth daily.  Dispense: 90 capsule; Refill: 1  IUD contraception -     Ambulatory referral to Obstetrics / Gynecology   Assessment and Plan    Breast mass Palpable mass under right breast for six months, increased in size. Non-tender, edge of breast tissue. Family history of breast cancer noted. Differential includes cyst vs mass vs nodule. - Order diagnostic mammogram and ultrasound.  Menstrual irregularities and contraceptive management Prolonged menstrual periods since October, possibly stress-related. Difficulty adhering to Loestrin. Interested in IUD for reliable contraception. Discussed IUD benefits. - Refer to [facility] for consultation on menstrual irregularities and IUD placement.  General Health Maintenance Occasional social smoker, willing to quit. Family history of diabetes. On Ozempic  for weight management. Lacks recent follow-up with Dr. Cheril Cork. - Encourage smoking cessation. - Schedule follow-up with Dr. Cheril Cork for routine health checks, including blood sugar monitoring.          Return if symptoms worsen or fail to improve, for type 2 diabetes.   Subjective:     Patient ID: Sheena Dean, female    DOB: [birth year]  Age: 23 y.o. MRN: [id number]  Chief Complaint  Patient presents with   Mass    Pt c/o "knot" under R breast, near ribs, that has gotten larger per pt.    Contraception    Pt would like to discuss starting birth control patch.     HPI Discussed the use of AI scribe software for clinical note transcription with the patient, who gave verbal consent to proceed.  History of Present Illness        Sheena Dean is a 23 year old female who  presents with a lump underneath her right breast, irregular menses, discussion of BC options and lastly depression/anxiety  She has a lump underneath her right breast that has been present for 6 months and is increasing in size. The lump is somewhat painful, but she is concerned about its growth.  There is a family history of breast cancer; her grandmother had breast cancer and passed away last year at the age of 7. Her mother has no history of cancer, and she has two younger sisters who are healthy.  She is currently taking oral contraceptive pills (Loestrin) but has difficulty remembering to take them and wants to switch to a birth control patch. Her menstrual cycles have been irregular, lasting up to two weeks since October, with a particularly heavy period in December that lasted almost a month.  She is on Ozempic  for weight management and type 2 diabetes, which is present in her family. Her grandmother on her father's side and her brother have diabetes. She was off Ozempic  for three weeks during a trip to [place]  but plans to resume it. Patient has not had labs done since January  She occasionally smokes, about one cigarette a month, and is not a regular caffeine consumer. She is a Consulting civil engineer at [employer], planning to study medical assisting, and lives in [place]. Physical Exam BREAST: Palpable nodule under right breast, superficial, non-tender. Results Assessment & Plan Breast mass Palpable mass under right breast for six months, increased in size. Non-tender, edge  of breast tissue. Family history of breast cancer noted. Differential includes cyst vs mass vs nodule. - Order diagnostic mammogram and ultrasound.  Menstrual irregularities and contraceptive management Prolonged menstrual periods since October, possibly stress-related. Difficulty adhering to Loestrin. Interested in IUD for reliable contraception. Discussed IUD benefits. - Refer to [facility] GYN for consultation on menstrual  irregularities and IUD placement.  General Health Maintenance Occasional social smoker, willing to quit. Family history of diabetes. On Ozempic  for weight management. Lacks recent follow-up with Dr. Cheril Cork. - Encourage smoking cessation. - Schedule follow-up with Dr. Cheril Cork for routine health checks, including blood sugar monitoring. Depression/anxiety- mentioned this at end of office visit-pt was on Prozac  about 3 -4 years ago and would like to restart. Feels more anxious the past month or so.  Denies SI or HI.     The ASCVD Risk score (Arnett DK, et al., 2019) failed to calculate for the following reasons:   The 2019 ASCVD risk score is only valid for ages 48 to 91  Past Medical History:  Diagnosis Date   Diabetes mellitus without complication (HCC)    PCOS (polycystic ovarian syndrome)    History reviewed. No pertinent surgical history. Social History   Tobacco Use   Smoking status: Never   Smokeless tobacco: Never   Tobacco comments:    Social smoker  Vaping Use   Vaping status: Every Day   Substances: Nicotine, Flavoring  Substance Use Topics   Alcohol use: No    Comment: occ   Drug use: No   Family History  Problem Relation Age of Onset   Obesity Brother    Breast cancer Maternal Grandmother 52   Allergies  Allergen Reactions   Latex Hives    ROS    Objective:    BP 120/62   Pulse 88   Ht 5\' 2"  (1.575 m)   Wt 216 lb 3.2 oz (98.1 kg)   LMP 06/26/2023   SpO2 98%   BMI 39.54 kg/m  BP Readings from Last 3 Encounters:  07/04/23 120/62  04/12/23 108/68  02/01/23 120/72   Wt Readings from Last 3 Encounters:  07/04/23 216 lb 3.2 oz (98.1 kg)  04/12/23 212 lb 4.8 oz (96.3 kg)  02/01/23 210 lb (95.3 kg)    Physical Exam Vitals and nursing note reviewed.  Constitutional:      Appearance: Normal appearance.  HENT:     Head: Normocephalic.     Right Ear: Tympanic membrane, ear canal and external ear normal.     Left Ear: Tympanic membrane, ear  canal and external ear normal.  Eyes:     Extraocular Movements: Extraocular movements intact.     Conjunctiva/sclera: Conjunctivae normal.     Pupils: Pupils are equal, round, and reactive to light.  Cardiovascular:     Rate and Rhythm: Normal rate and regular rhythm.     Heart sounds: Normal heart sounds.  Pulmonary:     Effort: Pulmonary effort is normal.     Breath sounds: Normal breath sounds.  Chest:  Breasts:    Right: Mass and tenderness present. No inverted nipple or nipple discharge.     Left: No inverted nipple, mass, nipple discharge, skin change or tenderness.     Comments: Approx 6 o'clock position has palpable cyst/mass 1.5 cm oblong in shapel, tender to palpation (difficult to differentiate between breast tissue vs chest wall) Musculoskeletal:     Right lower leg: No edema.     Left lower leg: No edema.  Neurological:  General: No focal deficit present.     Mental Status: She is alert and oriented to person, place, and time.  Psychiatric:        Mood and Affect: Mood normal.        Behavior: Behavior normal.        Thought Content: Thought content normal.        Judgment: Judgment normal.      No results found for any visits on 07/04/23.

## 2023-07-19 ENCOUNTER — Ambulatory Visit
Admission: RE | Admit: 2023-07-19 | Discharge: 2023-07-19 | Disposition: A | Discharge status: Home / Self Care | Source: Ambulatory Visit | Attending: Family Medicine | Admitting: Family Medicine

## 2023-07-19 DIAGNOSIS — N6310 Unspecified lump in the right breast, unspecified quadrant: Secondary | ICD-10-CM

## 2023-08-08 ENCOUNTER — Ambulatory Visit
Admission: RE | Admit: 2023-08-08 | Discharge: 2023-08-08 | Disposition: A | Discharge status: Home / Self Care | Source: Ambulatory Visit | Attending: Physician Assistant | Admitting: Physician Assistant

## 2023-08-08 VITALS — BP 105/70 | HR 88 | Temp 98.4°F | Resp 20 | Ht 60.0 in | Wt 200.0 lb

## 2023-08-08 DIAGNOSIS — Z113 Encounter for screening for infections with a predominantly sexual mode of transmission: Secondary | ICD-10-CM | POA: Insufficient documentation

## 2023-08-08 DIAGNOSIS — R350 Frequency of micturition: Secondary | ICD-10-CM | POA: Insufficient documentation

## 2023-08-08 LAB — POCT URINALYSIS DIP (MANUAL ENTRY)
Bilirubin, UA: NEGATIVE
Blood, UA: NEGATIVE
Glucose, UA: 100 mg/dL — AB
Leukocytes, UA: NEGATIVE
Nitrite, UA: NEGATIVE
Protein Ur, POC: NEGATIVE mg/dL
Spec Grav, UA: >=1.03 — AB (ref 1.010–1.025)
Urobilinogen, UA: 0.2 U/dL
pH, UA: 7 (ref 5.0–8.0)

## 2023-08-08 LAB — POCT FASTING CBG KUC MANUAL ENTRY: POCT Glucose (KUC): 126 mg/dL — AB (ref 70–99)

## 2023-08-08 NOTE — ED Triage Notes (Signed)
 Having pain when I pee! I'm assuming it's a UTI but I'm not sure! - Entered by patient

## 2023-08-09 ENCOUNTER — Ambulatory Visit (HOSPITAL_COMMUNITY): Payer: Self-pay

## 2023-08-09 LAB — CERVICOVAGINAL ANCILLARY ONLY
Bacterial Vaginitis (gardnerella): POSITIVE — AB
Candida Glabrata: NEGATIVE
Candida Vaginitis: NEGATIVE
Chlamydia: NEGATIVE
Comment: NEGATIVE
Comment: NEGATIVE
Comment: NEGATIVE
Comment: NEGATIVE
Comment: NEGATIVE
Comment: NORMAL
Neisseria Gonorrhea: NEGATIVE
Trichomonas: NEGATIVE

## 2023-08-09 LAB — RPR: RPR Ser Ql: NONREACTIVE

## 2023-08-09 LAB — HIV ANTIBODY (ROUTINE TESTING W REFLEX): HIV Screen 4th Generation wRfx: NONREACTIVE

## 2023-08-09 MED ORDER — METRONIDAZOLE 500 MG PO TABS: 500.0000 mg | ORAL_TABLET | Freq: Two times a day (BID) | ORAL | 0 refills | Status: AC

## 2023-08-09 NOTE — ED Provider Notes (Signed)
 [facility]    CSN: [id number] Arrival date & time: 08/08/23  1553      History   Chief Complaint Chief Complaint  Patient presents with   Urinary Frequency    HPI Sheena Dean is a 23 y.o. female.   Patient here today for evaluation of possible UTI.  She reports that she has had more pain with urination.  She denies any concerns for STDs but would like screening for same.  She has taken pregnancy test that were negative at home.  She does currently take semaglutide  for diabetes.  She has not had fever.   Urinary Frequency Pertinent negatives include no abdominal pain and no shortness of breath.    Past Medical History:  Diagnosis Date   Diabetes mellitus without complication (HCC)    PCOS (polycystic ovarian syndrome)     Patient Active Problem List   Diagnosis Date Noted   Moderate episode of recurrent major depressive disorder (HCC) 01/04/2021   Insomnia 01/04/2021   PCOS (polycystic ovarian syndrome) 02/24/2020   Diabetes mellitus (HCC) 02/24/2020   Tinea pedis of both feet 02/24/2020   UTI symptoms 02/10/2020   Class 2 obesity 01/19/2020   Acne vulgaris 12/15/2019   Strain of gastrocnemius tendon of right lower extremity 11/14/2017   Generalized anxiety disorder 07/16/2017   Depressed mood 07/16/2017    History reviewed. No pertinent surgical history.  OB History   No obstetric history on file.      Home Medications    Prior to Admission medications   Medication Sig Start Date End Date Taking? Authorizing Provider  Semaglutide ,0.25 or 0.5MG /DOS, 2 MG/3ML SOPN Inject 0.25 mg into the skin once a week. Patient taking differently: Inject 0.25 mg into the skin once a week. About 2 wks ago, last dose. 04/04/23  Yes Duanne Butler DASEN, MD  FLUoxetine  (PROZAC ) 10 MG capsule Take 1 capsule (10 mg total) by mouth daily. 07/04/23   Aletha Bene, MD  nitrofurantoin , macrocrystal-monohydrate, (MACROBID ) 100 MG capsule Take 1 capsule (100 mg total) by  mouth 2 (two) times daily. Patient not taking: Reported on 07/04/2023 04/12/23   Myrna Camelia HERO, NP  Norethindrone  Acetate-Ethinyl Estradiol  (LOESTRIN 1.5/30, 21,) 1.5-30 MG-MCG tablet Take 1 tablet by mouth daily. Patient not taking: Reported on 07/04/2023 02/01/23   Duanne Butler DASEN, MD  phenazopyridine (PYRIDIUM) 97 MG tablet Take 97 mg by mouth 3 (three) times daily as needed for pain. Patient not taking: Reported on 07/04/2023    [provider]  tirzepatide  (MOUNJARO ) 5 MG/0.5ML Pen Inject 5 mg into the skin once a week. Patient not taking: Reported on 02/01/2023 05/15/21   Duanne Butler DASEN, MD    Family History Family History  Problem Relation Age of Onset   Obesity Brother    Breast cancer Maternal Grandmother 44    Social History Social History   Tobacco Use   Smoking status: Never   Smokeless tobacco: Never   Tobacco comments:    Social smoker  Vaping Use   Vaping status: Former   Substances: Nicotine, Flavoring  Substance Use Topics   Alcohol use: Yes    Comment: Occassionally.   Drug use: Never     Allergies   Latex   Review of Systems Review of Systems  Constitutional:  Negative for chills and fever.  Eyes:  Negative for discharge and redness.  Respiratory:  Negative for shortness of breath.   Gastrointestinal:  Negative for abdominal pain, nausea and vomiting.  Genitourinary:  Positive  for frequency. Negative for vaginal discharge.     Physical Exam Triage Vital Signs ED Triage Vitals  Encounter Vitals Group     BP 08/08/23 1628 105/70     Girls Systolic BP Percentile --      Girls Diastolic BP Percentile --      Boys Systolic BP Percentile --      Boys Diastolic BP Percentile --      Pulse Rate 08/08/23 1628 88     Resp 08/08/23 1628 20     Temp 08/08/23 1628 98.4 F (36.9 C)     Temp Source 08/08/23 1628 Oral     SpO2 08/08/23 1628 96 %     Weight 08/08/23 1625 200 lb (90.7 kg)     Height 08/08/23 1625 5' (1.524 m)     Head  Circumference --      Peak Flow --      Pain Score 08/08/23 1624 0     Pain Loc --      Pain Education --      Exclude from Growth Chart --    No data found.  Updated Vital Signs BP 105/70 (BP Location: Left Arm)   Pulse 88   Temp 98.4 F (36.9 C) (Oral)   Resp 20   Ht 5' (1.524 m)   Wt 200 lb (90.7 kg)   LMP 03/30/2023 (Approximate)   SpO2 96%   BMI 39.06 kg/m   Visual Acuity Right Eye Distance:   Left Eye Distance:   Bilateral Distance:    Right Eye Near:   Left Eye Near:    Bilateral Near:     Physical Exam Vitals and nursing note reviewed.  Constitutional:      General: She is not in acute distress.    Appearance: Normal appearance. She is not ill-appearing.  HENT:     Head: Normocephalic and atraumatic.  Eyes:     Conjunctiva/sclera: Conjunctivae normal.  Cardiovascular:     Rate and Rhythm: Normal rate.  Pulmonary:     Effort: Pulmonary effort is normal. No respiratory distress.  Neurological:     Mental Status: She is alert.  Psychiatric:        Mood and Affect: Mood normal.        Behavior: Behavior normal.        Thought Content: Thought content normal.      UC Treatments / Results  Labs (all labs ordered are listed, but only abnormal results are displayed) Labs Reviewed  POCT URINALYSIS DIP (MANUAL ENTRY) - Abnormal; Notable for the following components:      Result Value   Glucose, UA =100 (*)    Ketones, POC UA trace (5) (*)    Spec Grav, UA >=1.030 (*)    All other components within normal limits  POCT FASTING CBG [facility] MANUAL ENTRY - Abnormal; Notable for the following components:   POCT Glucose ([facility]) 126 (*)    All other components within normal limits  URINE CULTURE  HIV ANTIBODY (ROUTINE TESTING W REFLEX)   Narrative:    Performed at:  [address] [facility] [address], [place], [place]  [id number] Lab Director: Frankey Sas MD, Phone:  [phone]  R[facility]   Narrative:    Performed at:  [address] [place] [address] [place], [place]  [id number] Lab Director: Frankey Sas MD, Phone:  [phone]  CERVICOVAGINAL ANCILLARY ONLY    EKG   Radiology No results found.  Procedures Procedures (including critical care  time)  Medications Ordered in UC Medications - No data to display  Initial Impression / Assessment and Plan / UC Course  I have reviewed the triage vital signs and the nursing notes.  Pertinent labs & imaging results that were available during my care of the patient were reviewed by me and considered in my medical decision making (see chart for details).    Reassured that urinalysis without signs of infection.  Will send for culture.  Glucose in urine likely from treatment of diabetes.  Advised that she continue to monitor.  Random glucose in office not significantly elevated.  STD screening ordered at her request.  Will await results for further recommendation.  Encouraged follow-up with any further concerns.  Final Clinical Impressions(s) / UC Diagnoses   Final diagnoses:  Urinary frequency  Screening for STD (sexually transmitted disease)   Discharge Instructions   None    ED Prescriptions   None    PDMP not reviewed this encounter.   Billy Asberry FALCON, PA-C 08/09/23 [phone]

## 2023-08-10 LAB — URINE CULTURE: Culture: 3000 — AB

## 2023-08-13 ENCOUNTER — Ambulatory Visit: Payer: Self-pay

## 2023-08-13 MED ORDER — AMOXICILLIN-POT CLAVULANATE 875-125 MG PO TABS: 1.0000 | ORAL_TABLET | Freq: Two times a day (BID) | ORAL | 0 refills | Status: AC

## 2023-08-13 NOTE — Telephone Encounter (Signed)
 See notes from UC.

## 2023-08-13 NOTE — Telephone Encounter (Signed)
 FYI Only or Action Required?: FYI only for provider.  Patient was last seen in primary care on 07/04/2023 by Aletha Bene, MD.  [facility] Nurse Triage for lab Results.   Triage Disposition: Call PCP Within 24 Hours  Patient/caregiver understands and will follow disposition?: Yes    Results reviewed with patient. Patient had no further questions at this time.      Copied from CRM [id number]. Topic: Clinical - Lab/Test Results >> Aug 13, 2023  9:43 AM Corin V wrote: Reason for CRM: Patient calling back about lab results. States she was just called in a scrip and is unsure what additional meds are for.    Reason for Disposition  Caller requesting lab results  (Exception: Routine or non-urgent lab result.)  Answer Assessment - Initial Assessment Questions 1. REASON FOR CALL or QUESTION: What is your reason for calling today? or How can I best     Lab results  2. CALLER: Document the source of call. (e.g., laboratory staff, caregiver or patient).     Patient  Protocols used: PCP Call - No Triage-A-AH

## 2023-09-03 ENCOUNTER — Other Ambulatory Visit: Payer: Self-pay | Admitting: Family Medicine

## 2023-09-03 NOTE — Telephone Encounter (Signed)
 Prescription Request  09/03/2023  LOV: 02/01/2023  What is the name of the medication or equipment?   Ozempic  (Semaglutide ),0.25 or 0.5MG /DOS, 2 MG/3ML SOPN   Have you contacted your pharmacy to request a refill? Yes   Which pharmacy would you like this sent to?  [id number] [address], [place] - [address] AT [facility] [address] [place] [address] Phone: [phone] Fax: [phone]    Patient notified that their request is being sent to the clinical staff for review and that they should receive a response within 2 business days.   Please advise pharmacist.

## 2023-09-05 MED ORDER — SEMAGLUTIDE(0.25 OR 0.5MG/DOS) 2 MG/3ML ~~LOC~~ SOPN: 0.2500 mg | PEN_INJECTOR | SUBCUTANEOUS | 0 refills | Status: AC

## 2023-09-05 NOTE — Telephone Encounter (Signed)
 Requested Prescriptions  Pending Prescriptions Disp Refills   Semaglutide ,0.25 or 0.5MG /DOS, 2 MG/3ML SOPN 3 mL 0    Sig: Inject 0.25 mg into the skin once a week. About 2 wks ago, last dose.     Endocrinology:  Diabetes - GLP-1 Receptor Agonists - semaglutide  Failed - 09/05/2023  8:57 AM      Failed - HBA1C in normal range and within 180 days    Hgb A1c MFr Bld  Date Value Ref Range Status  02/01/2023 7.0 (H) <5.7 % of total Hgb Final    Comment:    For someone without known diabetes, a hemoglobin A1c value of 6.5% or greater indicates that they may have  diabetes and this should be confirmed with a follow-up  test. . For someone with known diabetes, a value <7% indicates  that their diabetes is well controlled and a value  greater than or equal to 7% indicates suboptimal  control. A1c targets should be individualized based on  duration of diabetes, age, comorbid conditions, and  other considerations. . Currently, no consensus exists regarding use of hemoglobin A1c for diagnosis of diabetes for children. .          Passed - Cr in normal range and within 360 days    Creat  Date Value Ref Range Status  02/01/2023 0.69 0.50 - 0.96 mg/dL Final   Creatinine, Urine  Date Value Ref Range Status  02/01/2023 196 20 - 275 mg/dL Final         Passed - Valid encounter within last 6 months    Recent Outpatient Visits           2 months ago Mass of right breast, unspecified quadrant   [place] [facility] Aletha Bene, MD   7 months ago Type 2 diabetes mellitus with other specified complication, without long-term current use of insulin [facility])   [facility] Pick[place], Butler DASEN, MD

## 2023-09-23 ENCOUNTER — Ambulatory Visit: Admitting: Family Medicine

## 2023-11-05 ENCOUNTER — Encounter: Admitting: Obstetrics and Gynecology

## 2023-12-05 ENCOUNTER — Ambulatory Visit (INDEPENDENT_AMBULATORY_CARE_PROVIDER_SITE_OTHER): Admitting: Obstetrics and Gynecology

## 2023-12-05 ENCOUNTER — Encounter: Payer: Self-pay | Admitting: Obstetrics and Gynecology

## 2023-12-05 ENCOUNTER — Other Ambulatory Visit (HOSPITAL_COMMUNITY)
Admission: RE | Admit: 2023-12-05 | Discharge: 2023-12-05 | Disposition: A | Source: Ambulatory Visit | Attending: Obstetrics and Gynecology | Admitting: Obstetrics and Gynecology

## 2023-12-05 VITALS — BP 118/75 | HR 83 | Ht 62.0 in | Wt 214.0 lb

## 2023-12-05 DIAGNOSIS — Z01419 Encounter for gynecological examination (general) (routine) without abnormal findings: Secondary | ICD-10-CM | POA: Insufficient documentation

## 2023-12-05 DIAGNOSIS — E1169 Type 2 diabetes mellitus with other specified complication: Secondary | ICD-10-CM

## 2023-12-05 DIAGNOSIS — E282 Polycystic ovarian syndrome: Secondary | ICD-10-CM | POA: Diagnosis not present

## 2023-12-05 MED ORDER — NORELGESTROMIN-ETH ESTRADIOL 150-35 MCG/24HR TD PTWK: 1.0000 | MEDICATED_PATCH | TRANSDERMAL | 12 refills | Status: AC

## 2023-12-05 NOTE — Progress Notes (Signed)
 GYNECOLOGY ANNUAL PREVENTATIVE CARE ENCOUNTER NOTE  History:     Sheena Dean is a 23 y.o. G0P0000 female here for a routine annual gynecologic exam.  Current complaints: desires change in [facility].   Denies  discharge, pelvic pain, problems with intercourse or other gynecologic concerns. Pt does note type 2 diabetes and hx of PCOS.  She has used the patch in the past and wants to switch back because she forgets to take pills occasionally and felt the patch was better for her for cycle control regardless.  Some interest noted in progesterone IUD in the future, but it would be more for endometrial preservation.   Gynecologic History Patient's last menstrual period was 11/18/2023. Contraception: OCP (estrogen/progesterone) Last Pap: n/a Last mammogram: n/a  Obstetric History OB History  Gravida Para Term Preterm AB Living  0 0 0 0 0 0  SAB IAB Ectopic Multiple Live Births  0 0 0 0 0    Past Medical History:  Diagnosis Date   Diabetes mellitus without complication (HCC)    PCOS (polycystic ovarian syndrome)     Past Surgical History:  Procedure Laterality Date   MOUTH SURGERY     TONSILLECTOMY AND ADENOIDECTOMY      Current Outpatient Medications on File Prior to Visit  Medication Sig Dispense Refill   Semaglutide ,0.25 or 0.5MG /DOS, 2 MG/3ML SOPN Inject 0.25 mg into the skin once a week. About 2 wks ago, last dose. 3 mL 0   No current facility-administered medications on file prior to visit.    Allergies  Allergen Reactions   Latex Hives    Social History:  reports that she has never smoked. She has never used smokeless tobacco. She reports current alcohol use. She reports current drug use. Drug: Marijuana.  Family History  Problem Relation Age of Onset   Obesity Brother    Breast cancer Maternal Grandmother 35    The following portions of the patient's history were reviewed and updated as appropriate: allergies, current medications, past family history, past medical  history, past social history, past surgical history and problem list.  Review of Systems Pertinent items noted in HPI and remainder of comprehensive ROS otherwise negative.  Physical Exam:  BP 118/75   Pulse 83   Ht 5' 2 (1.575 m)   Wt 214 lb (97.1 kg)   LMP 11/18/2023   BMI 39.14 kg/m  CONSTITUTIONAL: Well-developed, well-nourished female in no acute distress.  HENT:  Normocephalic, atraumatic, External right and left ear normal. Oropharynx is clear and moist EYES: Conjunctivae and EOM are normal.  NECK: Normal range of motion, supple, no masses.  Normal thyroid. Acanthosis nigricans on the neck SKIN: Skin is warm and dry. No rash noted. Not diaphoretic. No erythema. No pallor.  Acanthosis nigricans noted underneath bilateral breasts MUSCULOSKELETAL: Normal range of motion. No tenderness.  No cyanosis, clubbing, or edema.  2+ distal pulses. NEUROLOGIC: Alert and oriented to person, place, and time. Normal reflexes, muscle tone coordination.  PSYCHIATRIC: Normal mood and affect. Normal behavior. Normal judgment and thought content. CARDIOVASCULAR: Normal heart rate noted, regular rhythm RESPIRATORY: Clear to auscultation bilaterally. Effort and breath sounds normal, no problems with respiration noted. BREASTS: Symmetric in size. No masses, tenderness, skin changes, nipple drainage, or lymphadenopathy bilaterally. Performed in the presence of a chaperone. ABDOMEN: Soft, no distention noted.  No tenderness, rebound or guarding.  PELVIC: Normal appearing external genitalia and urethral meatus; normal appearing vaginal mucosa and cervix.  No abnormal discharge noted.  Pap smear  obtained.  Normal uterine size, no other palpable masses, no uterine or adnexal tenderness.  Performed in the presence of a chaperone.   Assessment and Plan:    1. PCOS (polycystic ovarian syndrome) (Primary) Pt is on ozempic  , still advised 10% or greater weight loss to decrease insulin resistance  - HgB A1c -  norelgestromin -ethinyl estradiol  (XULANE) 150-35 MCG/24HR transdermal patch; Place 1 patch onto the skin once a week.  Dispense: 3 patch; Refill: 12  2. Women's annual routine gynecological examination Normal annual exam - Cytology - PAP( [place]) - Ambulatory referral to [facility]  3. Type 2 diabetes mellitus with other specified complication, without long-term current use of insulin (HCC) Pt unaware of current A1c, educated patient on use of A1c  Continue diet and exercise to decrease potential for diabetes - HgB A1c  Will follow up results of pap smear and manage accordingly. Rx for contraceptive patch, will need to watch for adhesive sensitivity Routine preventative health maintenance measures emphasized. Please refer to After Visit Summary for other counseling recommendations.     Virtual visit in 4 months to discuss cycle control Pt is a candidate for IUD if it is needed.  Jerilynn Buddle, MD, FACOG Obstetrician & Gynecologist, [facility], [facility]

## 2023-12-05 NOTE — Progress Notes (Signed)
 Pt is new to office, would like to discuss BC options. Last intercourse 10/25.

## 2023-12-06 ENCOUNTER — Ambulatory Visit: Payer: Self-pay | Admitting: Obstetrics and Gynecology

## 2023-12-06 LAB — HEMOGLOBIN A1C
Est. average glucose Bld gHb Est-mCnc: 148 mg/dL
Hgb A1c MFr Bld: 6.8 % — ABNORMAL HIGH (ref 4.8–5.6)

## 2023-12-09 LAB — CYTOLOGY - PAP: Diagnosis: NEGATIVE
